# Patient Record
Sex: Male | Born: 1990 | Race: Black or African American | Hispanic: No | Marital: Single | Smoking: Current some day smoker
Health system: Southern US, Community
[De-identification: ages and names within clinical notes are randomized; demographics above are authoritative.]

---
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
## 2018-06-13 LAB — HM HIV SCREENING LAB: HM HIV Screening: NEGATIVE

## 2019-03-04 ENCOUNTER — Ambulatory Visit: Payer: Self-pay

## 2019-05-13 ENCOUNTER — Ambulatory Visit: Payer: Self-pay | Admitting: Family Medicine

## 2019-05-13 ENCOUNTER — Other Ambulatory Visit: Payer: Self-pay

## 2019-05-13 ENCOUNTER — Encounter: Payer: Self-pay | Admitting: Family Medicine

## 2019-05-13 DIAGNOSIS — Z202 Contact with and (suspected) exposure to infections with a predominantly sexual mode of transmission: Secondary | ICD-10-CM

## 2019-05-13 DIAGNOSIS — Z113 Encounter for screening for infections with a predominantly sexual mode of transmission: Secondary | ICD-10-CM

## 2019-05-13 MED ORDER — CEFTRIAXONE SODIUM 250 MG IJ SOLR: 250.0000 mg | Freq: Once | INTRAMUSCULAR | Status: DC

## 2019-05-13 NOTE — Progress Notes (Signed)
Patient left from lab, never came back to clinic for treatment as contact to [facility]. Per C. Clydene Laming, TC to patient and patient telephone not accepting calls at this time. Jenetta Downer, RN

## 2019-05-13 NOTE — Progress Notes (Signed)
    STI clinic/screening visit  Subjective:  ELIA KEENUM is a 28 y.o. male being seen today for an STI screening visit. The patient reports they do have symptoms.  Patient has the following medical conditions:  There are no active problems to display for this patient.  Chief Complaint  Patient presents with  . Exposure to STD    HPI  Patient reports he is a contact to [facility].  He has a sm amt of yellow disch and "feels funny" when urinates.  See flowsheet for further details and programmatic requirements.    The following portions of the patient's history were reviewed and updated as appropriate: allergies, current medications, past medical history, past social history, past surgical history and problem list.  Objective:  There were no vitals filed for this visit.  Physical Exam HENT:     Mouth/Throat:     Mouth: Mucous membranes are moist.     Pharynx: No oropharyngeal exudate or posterior oropharyngeal erythema.  Neck:     Musculoskeletal: Neck supple. No muscular tenderness.  Genitourinary:    Penis: Normal.      Scrotum/Testes: Normal.     Comments: No discharge noted Lymphadenopathy:     Cervical: No cervical adenopathy.  Skin:    General: Skin is warm and dry.     Findings: No lesion or rash.  Neurological:     Mental Status: He is alert.    Assessment and Plan:  RUTHERFORD ALARIE is a 28 y.o. male presenting to the [facility] Department for STI screening  1. Screening examination for venereal disease - Ct, Ng, Mycoplasmas NAA, Urine  2. Exposure to sexually transmitted disease (STD) Contact to [facility]. - cefTRIAXone (ROCEPHIN) injection 250 mg -Azithromycin 1000 mg po once Co. No sexual activity x 1 wk.  Co. To use condoms always. Client left clinic before her received treatment for contact to [facility].  The clinic staff attempted to contact him by cell phone but were unable to leave a message     No follow-ups on file.  No future  appointments.  Hassell Done, FNP

## 2019-05-13 NOTE — Progress Notes (Signed)
Patient here for STD testing.Fleur Audino Brewer-Jensen, RN 

## 2019-05-16 ENCOUNTER — Other Ambulatory Visit: Payer: Self-pay

## 2019-05-16 ENCOUNTER — Telehealth: Payer: Self-pay | Admitting: General Practice

## 2019-05-16 ENCOUNTER — Ambulatory Visit: Payer: Self-pay

## 2019-05-16 DIAGNOSIS — Z202 Contact with and (suspected) exposure to infections with a predominantly sexual mode of transmission: Secondary | ICD-10-CM

## 2019-05-16 LAB — CT, NG, MYCOPLASMAS NAA, URINE
Chlamydia trachomatis, NAA: NEGATIVE
Mycoplasma genitalium NAA: NEGATIVE
Mycoplasma hominis NAA: NEGATIVE
Neisseria gonorrhoeae, NAA: POSITIVE — AB
Ureaplasma spp NAA: NEGATIVE

## 2019-05-16 MED ORDER — CEFTRIAXONE SODIUM 250 MG IJ SOLR
250.0000 mg | Freq: Once | INTRAMUSCULAR | Status: AC
  Administered 2019-05-16: 14:00:00 250 mg via INTRAMUSCULAR

## 2019-05-16 MED ORDER — AZITHROMYCIN 500 MG PO TABS
500.0000 mg | ORAL_TABLET | Freq: Once | ORAL | Status: AC
  Administered 2019-05-16: 1000 mg via ORAL

## 2019-07-31 ENCOUNTER — Telehealth: Payer: Self-pay | Admitting: Emergency Medicine

## 2019-07-31 DIAGNOSIS — J029 Acute pharyngitis, unspecified: Secondary | ICD-10-CM

## 2019-07-31 NOTE — Progress Notes (Signed)
Based on what you shared with me, I feel your condition warrants further evaluation and I recommend that you be seen for a face to face visit.  You reported several symptoms including neck stiffness, changes in your voice, neck swelling.  These are concerning and warrant a physical examination and face to face encounter to rule out more severe medical conditions.  Please contact your primary care physician practice to be seen. Many offices offer virtual options to be seen via video if you are not comfortable going in person to a medical facility at this time.  If you do not have a PCP, [facility] offers a free physician referral service available at [phone]. Our trained staff has the experience, knowledge and resources to put you in touch with a physician who is right for you.   You also have the option of a video visit through [email or web address]  If you are having a true medical emergency please call 911.  NOTE: If you entered your credit card information for this eVisit, you will not be charged. You may see a "hold" on your card for the $35 but that hold will drop off and you will not have a charge processed.  Your e-visit answers were reviewed by a board certified advanced clinical practitioner to complete your personal care plan.  Thank you for using e-Visits.

## 2019-12-17 NOTE — Telephone Encounter (Signed)
Opened in error

## 2022-03-12 ENCOUNTER — Emergency Department (HOSPITAL_COMMUNITY): Payer: BC Managed Care – PPO

## 2022-03-12 ENCOUNTER — Emergency Department (HOSPITAL_COMMUNITY)
Admission: EM | Admit: 2022-03-12 | Discharge: 2022-03-12 | Disposition: A | Discharge status: Home / Self Care | Payer: BC Managed Care – PPO | Attending: Emergency Medicine | Admitting: Emergency Medicine

## 2022-03-12 ENCOUNTER — Encounter (HOSPITAL_COMMUNITY): Payer: Self-pay

## 2022-03-12 ENCOUNTER — Other Ambulatory Visit: Payer: Self-pay

## 2022-03-12 DIAGNOSIS — R944 Abnormal results of kidney function studies: Secondary | ICD-10-CM | POA: Diagnosis not present

## 2022-03-12 DIAGNOSIS — F17210 Nicotine dependence, cigarettes, uncomplicated: Secondary | ICD-10-CM | POA: Insufficient documentation

## 2022-03-12 DIAGNOSIS — R42 Dizziness and giddiness: Secondary | ICD-10-CM | POA: Diagnosis present

## 2022-03-12 DIAGNOSIS — Z79899 Other long term (current) drug therapy: Secondary | ICD-10-CM | POA: Diagnosis not present

## 2022-03-12 DIAGNOSIS — R0789 Other chest pain: Secondary | ICD-10-CM | POA: Insufficient documentation

## 2022-03-12 DIAGNOSIS — F419 Anxiety disorder, unspecified: Secondary | ICD-10-CM | POA: Insufficient documentation

## 2022-03-12 DIAGNOSIS — T43625A Adverse effect of amphetamines, initial encounter: Secondary | ICD-10-CM | POA: Insufficient documentation

## 2022-03-12 DIAGNOSIS — T887XXA Unspecified adverse effect of drug or medicament, initial encounter: Secondary | ICD-10-CM | POA: Insufficient documentation

## 2022-03-12 DIAGNOSIS — E860 Dehydration: Secondary | ICD-10-CM

## 2022-03-12 DIAGNOSIS — E876 Hypokalemia: Secondary | ICD-10-CM | POA: Diagnosis not present

## 2022-03-12 LAB — URINALYSIS, ROUTINE W REFLEX MICROSCOPIC
Bacteria, UA: NONE SEEN
Bilirubin Urine: NEGATIVE
Glucose, UA: NEGATIVE mg/dL
Hgb urine dipstick: NEGATIVE
Ketones, ur: 20 mg/dL — AB
Nitrite: NEGATIVE
Protein, ur: NEGATIVE mg/dL
Specific Gravity, Urine: 1.01 (ref 1.005–1.030)
pH: 8 (ref 5.0–8.0)

## 2022-03-12 LAB — RAPID URINE DRUG SCREEN, HOSP PERFORMED
Amphetamines: POSITIVE — AB
Barbiturates: NOT DETECTED
Benzodiazepines: NOT DETECTED
Cocaine: NOT DETECTED
Opiates: NOT DETECTED
Tetrahydrocannabinol: POSITIVE — AB

## 2022-03-12 LAB — COMPREHENSIVE METABOLIC PANEL WITH GFR
ALT: 35 U/L (ref 0–44)
AST: 34 U/L (ref 15–41)
Albumin: 4.4 g/dL (ref 3.5–5.0)
Alkaline Phosphatase: 73 U/L (ref 38–126)
Anion gap: 11 (ref 5–15)
BUN: 17 mg/dL (ref 6–20)
CO2: 18 mmol/L — ABNORMAL LOW (ref 22–32)
Calcium: 10 mg/dL (ref 8.9–10.3)
Chloride: 110 mmol/L (ref 98–111)
Creatinine, Ser: 1.39 mg/dL — ABNORMAL HIGH (ref 0.61–1.24)
GFR, Estimated: >60 mL/min
Glucose, Bld: 106 mg/dL — ABNORMAL HIGH (ref 70–99)
Potassium: 3.1 mmol/L — ABNORMAL LOW (ref 3.5–5.1)
Sodium: 139 mmol/L (ref 135–145)
Total Bilirubin: 1.8 mg/dL — ABNORMAL HIGH (ref 0.3–1.2)
Total Protein: 8.2 g/dL — ABNORMAL HIGH (ref 6.5–8.1)

## 2022-03-12 LAB — CBC
HCT: 38.2 % — ABNORMAL LOW (ref 39.0–52.0)
Hemoglobin: 13.5 g/dL (ref 13.0–17.0)
MCH: 32.7 pg (ref 26.0–34.0)
MCHC: 35.3 g/dL (ref 30.0–36.0)
MCV: 92.5 fL (ref 80.0–100.0)
Platelets: 258 10*3/uL (ref 150–400)
RBC: 4.13 MIL/uL — ABNORMAL LOW (ref 4.22–5.81)
RDW: 12.5 % (ref 11.5–15.5)
WBC: 7 10*3/uL (ref 4.0–10.5)
nRBC: 0 % (ref 0.0–0.2)

## 2022-03-12 LAB — CBG MONITORING, ED: Glucose-Capillary: 119 mg/dL — ABNORMAL HIGH (ref 70–99)

## 2022-03-12 MED ORDER — HYDROXYZINE HCL 25 MG PO TABS
25.0000 mg | ORAL_TABLET | Freq: Once | ORAL | Status: AC
  Administered 2022-03-12: 25 mg via ORAL
  Filled 2022-03-12: qty 1

## 2022-03-12 MED ORDER — POTASSIUM CHLORIDE CRYS ER 20 MEQ PO TBCR
40.0000 meq | EXTENDED_RELEASE_TABLET | Freq: Once | ORAL | Status: AC
  Administered 2022-03-12: 40 meq via ORAL
  Filled 2022-03-12: qty 2

## 2022-03-12 NOTE — Discharge Instructions (Addendum)
Your drug screen today is positive for marijuana as well as amphetamines.  The remainder of your evaluation has been reassuring.  You did have a low potassium which is likely related to diet and can be rechecked by a primary care doctor along with your kidney function which was slightly elevated today.  We recommend follow-up with a primary care doctor within 2 to 4 weeks.  Discontinue use of illicit substances.  Return for new or concerning symptoms.

## 2022-03-12 NOTE — ED Provider Triage Note (Signed)
Emergency Medicine Provider Triage Evaluation Note  Herbert Montgomery , a 31 y.o. male  was evaluated in triage.  Pt complains of feeling some tingling in his extremities, chest tightness, panicky sensation, shortness of breath after smoking and drinking last night.  Patient reports that he thinks that what he was smoking may have been laced with something other than marijuana.  He is not sure what it could have been laced with.  He denies any overt chest pain.  When asked to clarify his tingling sensation he denies numbness.  He denies headache, but does not endorse some mild blurry vision.  He denies double vision..  Review of Systems  Positive: Chest tightness, shob, tingling, blurry vision Negative: Chest pain, fever, chills  Physical Exam  BP (!) 194/107 (BP Location: Left Arm)   Pulse 79   Temp 97.8 F (36.6 C) (Oral)   Resp 16   Ht 6\' 4"  (1.93 m)   Wt 120.2 kg   SpO2 96%   BMI 32.26 kg/m  Gen:   Awake, anxious appearing Resp:  Normal effort  MSK:   Moves extremities without difficulty  Other:  Normal sensation throughout, moves all extremities without difficulty  Medical Decision Making  Medically screening exam initiated at 3:14 PM.  Appropriate orders placed.  was informed that the remainder of the evaluation will be completed by another provider, this initial triage assessment does not replace that evaluation, and the importance of remaining in the ED until their evaluation is complete.  Workup initiated   Archie Balboa, Olene Floss 03/12/22 1515

## 2022-03-12 NOTE — ED Provider Notes (Signed)
[facility]-EMERGENCY DEPT Provider Note   CSN: [id number] Arrival date & time: 03/12/22  1453     History  Chief Complaint  Patient presents with   Chest Pain   Dizziness    Herbert Montgomery is a 31 y.o. male.  31 year old male presents to the emergency department complaining of chest tightness and a panicky sensation with shortness of breath.  Symptoms have been waxing and waning since onset and intermittent.  He denies any known aggravating factors, but states that his symptoms began after drinking alcohol and smoking marijuana last night.  Reports being told by his friend that the marijuana was laced with something, but he is unsure what.  He has not attempted any interventions prior to arrival.  Does note that he has had sporadic episodes of associated paresthesias in his bilateral hands and around his mouth.  No associated numbness, syncope.  The history is provided by the patient. No language interpreter was used.  Chest Pain Associated symptoms: dizziness   Dizziness Associated symptoms: chest pain        Home Medications Prior to Admission medications   Not on File      Allergies    Patient has no known allergies.    Review of Systems   Review of Systems  Cardiovascular:  Positive for chest pain.  Neurological:  Positive for dizziness.  Ten systems reviewed and are negative for acute change, except as noted in the HPI.    Physical Exam Updated Vital Signs BP (!) 162/90   Pulse 71   Temp 97.8 F (36.6 C) (Oral)   Resp (!) 23   Ht 6\' 4"  (1.93 m)   Wt 120.2 kg   SpO2 100%   BMI 32.26 kg/m   Physical Exam Vitals and nursing note reviewed.  Constitutional:      General: He is not in acute distress.    Appearance: He is well-developed. He is not diaphoretic.     Comments: Anxious appearing  HENT:     Head: Normocephalic and atraumatic.  Eyes:     General: No scleral icterus.    Conjunctiva/sclera: Conjunctivae normal.   Cardiovascular:     Rate and Rhythm: Normal rate and regular rhythm.     Pulses: Normal pulses.  Pulmonary:     Effort: Pulmonary effort is normal. No respiratory distress.     Breath sounds: No stridor. No wheezing.     Comments: Intermittent tachypnea without dyspnea; mild. Chest expansion symmetric. Lungs CTAB. Musculoskeletal:        General: Normal range of motion.     Cervical back: Normal range of motion.  Skin:    General: Skin is warm and dry.     Coloration: Skin is not pale.     Findings: No erythema or rash.  Neurological:     Mental Status: He is alert and oriented to person, place, and time.     Coordination: Coordination normal.  Psychiatric:        Behavior: Behavior normal.     ED Results / Procedures / Treatments   Labs (all labs ordered are listed, but only abnormal results are displayed) Labs Reviewed  CBC - Abnormal; Notable for the following components:      Result Value   RBC 4.13 (*)    HCT 38.2 (*)    All other components within normal limits  COMPREHENSIVE METABOLIC PANEL - Abnormal; Notable for the following components:   Potassium 3.1 (*)    CO2  18 (*)    Glucose, Bld 106 (*)    Creatinine, Ser 1.39 (*)    Total Protein 8.2 (*)    Total Bilirubin 1.8 (*)    All other components within normal limits  URINALYSIS, ROUTINE W REFLEX MICROSCOPIC - Abnormal; Notable for the following components:   Ketones, ur 20 (*)    Leukocytes,Ua SMALL (*)    All other components within normal limits  RAPID URINE DRUG SCREEN, HOSP PERFORMED - Abnormal; Notable for the following components:   Amphetamines POSITIVE (*)    Tetrahydrocannabinol POSITIVE (*)    All other components within normal limits  CBG MONITORING, ED - Abnormal; Notable for the following components:   Glucose-Capillary 119 (*)    All other components within normal limits  MAGNESIUM    EKG EKG Interpretation  Date/Time:  Sunday March 12 2022 15:20:31 EDT Ventricular Rate:  76 PR  Interval:  147 QRS Duration: 95 QT Interval:  368 QTC Calculation: 414 R Axis:   20 Text Interpretation: Sinus rhythm Probable anteroseptal infarct, old Confirmed by Virgina Norfolk [phone]) on 03/12/2022 10:48:34 PM  Radiology DG Chest 2 View  Result Date: 03/12/2022 CLINICAL DATA:  Acute chest pain and shortness of breath. EXAM: CHEST - 2 VIEW COMPARISON:  None Available. FINDINGS: The cardiomediastinal silhouette is unremarkable. There is no evidence of focal airspace disease, pulmonary edema, suspicious pulmonary nodule/mass, pleural effusion, or pneumothorax. No acute bony abnormalities are identified. IMPRESSION: No active cardiopulmonary disease. Electronically Signed   By: Harmon Pier M.D.   On: 03/12/2022 15:49    Procedures Procedures    Medications Ordered in ED Medications  potassium chloride SA (KLOR-CON M) CR tablet 40 mEq (has no administration in time range)  hydrOXYzine (ATARAX) tablet 25 mg (has no administration in time range)    ED Course/ Medical Decision Making/ A&P Clinical Course as of 03/12/22 2304  Sun Mar 12, 2022  2258 Labs reviewed and notable for creatinine of 1.39.  This is favored to reflect dehydration given mild associated ketonuria.  Hypokalemia of 3.1 repleted with oral potassium.  Magnesium level added.  UDS notable for amphetamines as well as marijuana c/w reported story of smoking "laced" marijuana yesterday.  [KH]    Clinical Course User Index [KH] Antony Madura, PA-C                           Medical Decision Making Amount and/or Complexity of Data Reviewed Labs: ordered.  Risk Prescription drug management.   This patient presents to the ED for concern of chest tightness, SOB, paresthesias to face and hands, this involves an extensive number of treatment options, and is a complaint that carries with it a high risk of complications and morbidity.  The differential diagnosis includes substance abuse vs anxiety attack vs electrolyte derangement  vs ACS   Co morbidities that complicate the patient evaluation  None    Additional history obtained:  External records from outside source obtained and reviewed including prior BP readings from urgent care visit in 2022    Lab Tests:  I Ordered, and personally interpreted labs.  The pertinent results include:  potassium of 3.1, creatinine of 1.39, UDS positive for THC, amphetamines.   Imaging Studies ordered:  I ordered imaging studies including CXR  I independently visualized and interpreted imaging which showed no acute cardiopulmonary abnormality I agree with the radiologist interpretation   Cardiac Monitoring:  The patient was maintained on a cardiac monitor.  I personally viewed and interpreted the cardiac monitored which showed an underlying rhythm of: NSR   Medicines ordered and prescription drug management:  I ordered medication including atarax for anxiety, potassium for hypokalemia  Reevaluation of the patient after these medicines showed that the patient improved I have reviewed the patients home medicines and have made adjustments as needed   Test Considered:  Troponin    Critical Interventions:  Oral potassium for repletion Atarax for symptom management   Reevaluation:  After the interventions noted above, I reevaluated the patient and found that they have : remained stable   Social Determinants of Health:  Cigarette smoker Uninsured    Dispostion:  After consideration of the diagnostic results and the patients response to treatment, I feel that the patent would benefit from discontinuation of illicit substance use. Suspect panic attacks/heightened anxiety episodes initially triggered by amphetamine use. Work up today has been otherwise reassuring.  Encouraged PCP follow up for recheck of potassium and creatinine level. Return precautions discussed and provided. Patient discharged in stable condition with no unaddressed concerns.           Final Clinical Impression(s) / ED Diagnoses Final diagnoses:  Amphetamine adverse reaction, initial encounter  Hypokalemia  Mild dehydration    Rx / DC Orders ED Discharge Orders     None         Antony Madura, PA-C 03/12/22 2312    Virgina Norfolk, DO 03/12/22 2320

## 2022-03-12 NOTE — ED Triage Notes (Addendum)
Pt reports he was drinking and smoking marijuana last night. Pt thinks the marijuana may have been laced with something. Pt is anxious and jittery in triage. Pt endorses chest pain, SHOB, tingling in extremities, and dizziness.

## 2022-03-13 ENCOUNTER — Emergency Department (HOSPITAL_BASED_OUTPATIENT_CLINIC_OR_DEPARTMENT_OTHER): Payer: BC Managed Care – PPO

## 2022-03-13 ENCOUNTER — Encounter (HOSPITAL_BASED_OUTPATIENT_CLINIC_OR_DEPARTMENT_OTHER): Payer: Self-pay

## 2022-03-13 ENCOUNTER — Other Ambulatory Visit: Payer: Self-pay

## 2022-03-13 ENCOUNTER — Emergency Department (HOSPITAL_BASED_OUTPATIENT_CLINIC_OR_DEPARTMENT_OTHER)
Admission: EM | Admit: 2022-03-13 | Discharge: 2022-03-13 | Disposition: A | Discharge status: Home / Self Care | Payer: BC Managed Care – PPO | Attending: Emergency Medicine | Admitting: Emergency Medicine

## 2022-03-13 DIAGNOSIS — R200 Anesthesia of skin: Secondary | ICD-10-CM | POA: Diagnosis present

## 2022-03-13 DIAGNOSIS — I10 Essential (primary) hypertension: Secondary | ICD-10-CM | POA: Insufficient documentation

## 2022-03-13 DIAGNOSIS — F17200 Nicotine dependence, unspecified, uncomplicated: Secondary | ICD-10-CM | POA: Diagnosis not present

## 2022-03-13 DIAGNOSIS — F419 Anxiety disorder, unspecified: Secondary | ICD-10-CM | POA: Insufficient documentation

## 2022-03-13 LAB — MAGNESIUM: Magnesium: 2.7 mg/dL — ABNORMAL HIGH (ref 1.7–2.4)

## 2022-03-13 MED ORDER — LORAZEPAM 1 MG PO TABS
0.5000 mg | ORAL_TABLET | Freq: Once | ORAL | Status: AC
Start: 2022-03-13 — End: 2022-03-13
  Administered 2022-03-13: 0.5 mg via ORAL
  Filled 2022-03-13: qty 1

## 2022-03-13 NOTE — ED Triage Notes (Signed)
Patient here POV from Home.  Endorses trying Amphetamine Saturday PM/Sunday AM. States he was Jittery and Anxious afterwards. Seen in ED yesterday PM for Same and discharged.   States However Symptoms have Continued and Symptoms include Numbness to Right Face and Right Arm. Right Arm Numbness began Sunday AM and Right Facial Numbness began at 0800 today.   No Pain. No SOB.   Anxious in Triage. A&Ox4. GCS 15. Ambulatory.

## 2022-03-13 NOTE — ED Provider Notes (Signed)
[facility] EMERGENCY DEPT Provider Note   CSN: [id number] Arrival date & time: 03/13/22  1256     History  Chief Complaint  Patient presents with   Numbness    Herbert Montgomery is a 31 y.o. male with no known past medical history presenting today with the complaint of numbness to various parts of his body since yesterday.  He says he first felt it in his left arm, that his right arm, then his left face, then his fingers.  He says it all started after smoking meth last night.  He was seen at [facility] and discharged and says he has not used any drugs since then.  HPI     Home Medications Prior to Admission medications   Not on File      Allergies    Patient has no known allergies.    Review of Systems   Review of Systems  Physical Exam Updated Vital Signs BP (!) 162/97 (BP Location: Right Arm)   Pulse (!) 57   Temp 98.9 F (37.2 C) (Oral)   Resp 17   Ht 6\' 4"  (1.93 m)   Wt 120.2 kg   SpO2 100%   BMI 32.26 kg/m  Physical Exam Vitals and nursing note reviewed.  Constitutional:      Appearance: Normal appearance.  HENT:     Head: Normocephalic and atraumatic.     Mouth/Throat:     Mouth: Mucous membranes are moist.     Pharynx: Oropharynx is clear.  Eyes:     General: No scleral icterus.    Conjunctiva/sclera: Conjunctivae normal.  Pulmonary:     Effort: Pulmonary effort is normal. No respiratory distress.  Skin:    Findings: No rash.  Neurological:     Mental Status: He is alert.     Comments: Cranial nerves II through XII grossly intact.  Normal strength of bilateral upper and lower extremities.  Ambulatory in the hallway.  No problem with finger-nose or heel shin  Psychiatric:     Comments: Very anxious     ED Results / Procedures / Treatments   Labs (all labs ordered are listed, but only abnormal results are displayed) Labs Reviewed - No data to display  EKG EKG Interpretation  Date/Time:  Monday March 13 2022 14:40:18  EDT Ventricular Rate:  55 PR Interval:  154 QRS Duration: 94 QT Interval:  438 QTC Calculation: 419 R Axis:   17 Text Interpretation: Sinus rhythm Confirmed by 06-04-1994 [phone]) on 03/13/2022 3:01:05 PM  Radiology CT Head Wo Contrast  Result Date: 03/13/2022 CLINICAL DATA:  Neuro deficit, drug use EXAM: CT HEAD WITHOUT CONTRAST TECHNIQUE: Contiguous axial images were obtained from the base of the skull through the vertex without intravenous contrast. RADIATION DOSE REDUCTION: This exam was performed according to the departmental dose-optimization program which includes automated exposure control, adjustment of the mA and/or kV according to patient size and/or use of iterative reconstruction technique. COMPARISON:  None Available. FINDINGS: Brain: No acute intracranial hemorrhage, mass effect, or herniation. No extra-axial fluid collections. No evidence of acute territorial infarct. No hydrocephalus. Vascular: No hyperdense vessel or unexpected calcification. Skull: Normal. Negative for fracture or focal lesion. Sinuses/Orbits: No acute finding. Other: None. IMPRESSION: No acute intracranial process identified. Electronically Signed   By: 03/15/2022 M.D.   On: 03/13/2022 14:53   DG Chest 2 View  Result Date: 03/12/2022 CLINICAL DATA:  Acute chest pain and shortness of breath. EXAM: CHEST - 2 VIEW COMPARISON:  None Available. FINDINGS: The cardiomediastinal silhouette is unremarkable. There is no evidence of focal airspace disease, pulmonary edema, suspicious pulmonary nodule/mass, pleural effusion, or pneumothorax. No acute bony abnormalities are identified. IMPRESSION: No active cardiopulmonary disease. Electronically Signed   By: Harmon Pier M.D.   On: 03/12/2022 15:49    Procedures Procedures   Medications Ordered in ED Medications  LORazepam (ATIVAN) tablet 0.5 mg (0.5 mg Oral Given 03/13/22 1438)    ED Course/ Medical Decision Making/ A&P                           Medical  Decision Making Amount and/or Complexity of Data Reviewed Radiology: ordered.  Risk Prescription drug management.   This patient presents to the ED for concern of numbness.  Differential includes but is not limited to electrolyte abnormality, substance use, CVA, brain mass, neuropathy.   This is not an exhaustive differential.    Past Medical History / Co-morbidities / Social History: Recent drug use   Additional history: Chart review.  Patient had episodes of hypertension years ago.  Reviewed patient's visit to the ED yesterday as well.  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   Physical Exam: No pertinent findings outside of patient's anxiety.  Lab Tests: Not repeated from last night   Imaging Studies: I ordered and independently visualized and interpreted CT head which was normal.  I agree with radiologist.   Medications: I ordered a small dose of Ativan which he reports helped on reevaluation.   Disposition: After consideration of the diagnostic results and the patients response to treatment, I feel that patient does not have an emergent condition requiring hospitalization today.  I considered admission however patient is likely suffering from anxiety or left over side effect of illicit drugs.  It is possible that he has hypertension at baseline so I referred him to primary care to be sure that this is followed.  Return precautions discussed and he voiced understanding.   I discussed this case with my attending physician Dr. Jacqulyn Bath who cosigned this note including patient's presenting symptoms, physical exam, and planned diagnostics and interventions. Attending physician stated agreement with plan or made changes to plan which were implemented.      Final Clinical Impression(s) / ED Diagnoses Final diagnoses:  Anxiety    Rx / DC Orders ED Discharge Orders     None      Results and diagnoses  were explained to the patient. Return precautions discussed in full. Patient had no additional questions and expressed complete understanding.   This chart was dictated using voice recognition software.  Despite best efforts to proofread,  errors can occur which can change the documentation meaning.    Woodroe Chen 03/13/22 1546    Maia Plan, MD 03/14/22 [phone]

## 2022-03-13 NOTE — Discharge Instructions (Signed)
I have attached your primary care offices to these papers however you may call any office of your choosing for follow-up.  I suspect this is all secondary to your weekend ingestion.  Return with any worsening symptoms however your CT today and your lab work yesterday are reassuring.

## 2022-03-13 NOTE — ED Notes (Signed)
MD J. Long consulted regarding Patient Presentation. Patient to be assessed in Examination Room when Available by EDP prior to Imaging.

## 2022-03-14 ENCOUNTER — Emergency Department (HOSPITAL_COMMUNITY): Payer: BC Managed Care – PPO

## 2022-03-14 ENCOUNTER — Emergency Department (HOSPITAL_COMMUNITY)
Admission: EM | Admit: 2022-03-14 | Discharge: 2022-03-14 | Disposition: A | Discharge status: Home / Self Care | Payer: BC Managed Care – PPO | Attending: Emergency Medicine | Admitting: Emergency Medicine

## 2022-03-14 ENCOUNTER — Other Ambulatory Visit: Payer: Self-pay

## 2022-03-14 ENCOUNTER — Encounter (HOSPITAL_COMMUNITY): Payer: Self-pay

## 2022-03-14 DIAGNOSIS — R0789 Other chest pain: Secondary | ICD-10-CM | POA: Diagnosis present

## 2022-03-14 DIAGNOSIS — F419 Anxiety disorder, unspecified: Secondary | ICD-10-CM | POA: Insufficient documentation

## 2022-03-14 DIAGNOSIS — R072 Precordial pain: Secondary | ICD-10-CM | POA: Insufficient documentation

## 2022-03-14 DIAGNOSIS — E876 Hypokalemia: Secondary | ICD-10-CM | POA: Insufficient documentation

## 2022-03-14 LAB — BASIC METABOLIC PANEL
Anion gap: 11 (ref 5–15)
BUN: 18 mg/dL (ref 6–20)
CO2: 18 mmol/L — ABNORMAL LOW (ref 22–32)
Calcium: 9.4 mg/dL (ref 8.9–10.3)
Chloride: 108 mmol/L (ref 98–111)
Creatinine, Ser: 1.53 mg/dL — ABNORMAL HIGH (ref 0.61–1.24)
GFR, Estimated: >60 mL/min (ref >60)
Glucose, Bld: 74 mg/dL (ref 70–99)
Potassium: 3.2 mmol/L — ABNORMAL LOW (ref 3.5–5.1)
Sodium: 137 mmol/L (ref 135–145)

## 2022-03-14 LAB — CBC WITH DIFFERENTIAL/PLATELET
Abs Immature Granulocytes: 0.01 10*3/uL (ref 0.00–0.07)
Basophils Absolute: 0 10*3/uL (ref 0.0–0.1)
Basophils Relative: 0 %
Eosinophils Absolute: 0.1 10*3/uL (ref 0.0–0.5)
Eosinophils Relative: 1 %
HCT: 41.7 % (ref 39.0–52.0)
Hemoglobin: 14.3 g/dL (ref 13.0–17.0)
Immature Granulocytes: 0 %
Lymphocytes Relative: 34 %
Lymphs Abs: 1.7 10*3/uL (ref 0.7–4.0)
MCH: 32.6 pg (ref 26.0–34.0)
MCHC: 34.3 g/dL (ref 30.0–36.0)
MCV: 95 fL (ref 80.0–100.0)
Monocytes Absolute: 0.7 10*3/uL (ref 0.1–1.0)
Monocytes Relative: 13 %
Neutro Abs: 2.6 10*3/uL (ref 1.7–7.7)
Neutrophils Relative %: 52 %
Platelets: 249 10*3/uL (ref 150–400)
RBC: 4.39 MIL/uL (ref 4.22–5.81)
RDW: 12.8 % (ref 11.5–15.5)
WBC: 5 10*3/uL (ref 4.0–10.5)
nRBC: 0 % (ref 0.0–0.2)

## 2022-03-14 LAB — TROPONIN I (HIGH SENSITIVITY): Troponin I (High Sensitivity): 3 ng/L (ref <18)

## 2022-03-14 MED ORDER — HYDROXYZINE HCL 25 MG PO TABS: 25.0000 mg | ORAL_TABLET | Freq: Four times a day (QID) | ORAL | 0 refills | Status: AC

## 2022-03-14 NOTE — ED Provider Notes (Signed)
[facility]-EMERGENCY DEPT Provider Note   CSN: [id number] Arrival date & time: 03/14/22  1351     History  Chief Complaint  Patient presents with   Chest Pain    Herbert Montgomery is a 31 y.o. male here for evaluation of intermittent chest tightness.  Symptoms intermittent in nature.  States symptoms began after working some alcohol and smoking marijuana which was laced with amphetamines.  States intermittently he will feel like he has chest tightness, palpitations and then will feel intermittent pin prick sensations all over his body.  These are never unilateral.  Last for few seconds and resolved.  States when this occurs he feels anxious.  He denies any exertional or pleuritic chest pain.  He has no facial droop, lateral weakness, numbness.  He denies any current pain.  This is his third time being assessed for similar.  No history of PE, DVT, recent surgery, mobilization or malignancy.  No headache, back pain, abdominal pain.  States he was given a medication yesterday in the ED which helped his symptoms however he was not discharged with any medications.  He denies any SI, HI, AVH.  He has never had anything like this previously.  No history of AAA, dissection. No recent sick contacts, Fever, emesis.  HPI     Home Medications Prior to Admission medications   Medication Sig Start Date End Date Taking? Authorizing Provider  hydrOXYzine (ATARAX) 25 MG tablet Take 1 tablet (25 mg total) by mouth every 6 (six) hours. 03/14/22  Yes Parisa Pinela A, PA-C      Allergies    Patient has no known allergies.    Review of Systems   Review of Systems  Constitutional: Negative.   HENT: Negative.    Respiratory: Negative.    Cardiovascular:  Positive for chest pain (intermittent tightness, none currently).  Gastrointestinal: Negative.   Genitourinary: Negative.   Musculoskeletal: Negative.   Skin: Negative.   Neurological:  Positive for numbness. Negative for  dizziness, tremors, seizures, syncope, facial asymmetry, speech difficulty, weakness, light-headedness and headaches.  All other systems reviewed and are negative.  Physical Exam Updated Vital Signs BP (!) 151/96   Pulse 79   Temp 98.9 F (37.2 C) (Oral)   Resp 18   Ht 6\' 4"  (1.93 m)   Wt 120.2 kg   SpO2 100%   BMI 32.26 kg/m  Physical Exam Physical Exam  Constitutional: Pt is oriented to person, place, and time. Pt appears well-developed and well-nourished. No distress.  HENT:  Head: Normocephalic and atraumatic.  Mouth/Throat: Oropharynx is clear and moist.  Eyes: Conjunctivae and EOM are normal. Pupils are equal, round, and reactive to light. No scleral icterus.  Neck: Normal range of motion. Neck supple.  Full active and passive ROM without pain No midline or paraspinal tenderness No nuchal rigidity or meningeal signs  Cardiovascular: Normal rate, regular rhythm and intact distal pulses.   Pulmonary/Chest: Effort normal and breath sounds normal. No respiratory distress. Pt has no wheezes. No rales.  Abdominal: Soft. Bowel sounds are normal. There is no tenderness. There is no rebound and no guarding.  Musculoskeletal: Normal range of motion.  Lymphadenopathy:    No cervical adenopathy.  Neurological: Pt. is alert and oriented to person, place, and time. He has normal reflexes. No cranial nerve deficit.  Exhibits normal muscle tone. Coordination normal.  Mental Status:  Alert, oriented, thought content appropriate. Speech fluent without evidence of aphasia. Able to follow 2 step commands without difficulty.  Cranial Nerves:  II:  Peripheral visual fields grossly normal, pupils equal, round, reactive to light III,IV, VI: ptosis not present, extra-ocular motions intact bilaterally  V,VII: smile symmetric, facial light touch sensation equal VIII: hearing grossly normal bilaterally  IX,X: midline uvula rise  XI: bilateral shoulder shrug equal and strong XII: midline tongue  extension  Motor:  5/5 in upper and lower extremities bilaterally including strong and equal grip strength and dorsiflexion/plantar flexion Sensory: Pinprick and light touch normal in all extremities.  Deep Tendon Reflexes: 2+ and symmetric  Cerebellar: normal finger-to-nose with bilateral upper extremities Gait: normal gait and balance CV: distal pulses palpable throughout   Skin: Skin is warm and dry. No rash noted. Pt is not diaphoretic.  Psychiatric: Pt has a normal mood and affect. Behavior is normal. Judgment and thought content normal.  Nursing note and vitals reviewed.  ED Results / Procedures / Treatments   Labs (all labs ordered are listed, but only abnormal results are displayed) Labs Reviewed  BASIC METABOLIC PANEL - Abnormal; Notable for the following components:      Result Value   Potassium 3.2 (*)    CO2 18 (*)    Creatinine, Ser 1.53 (*)    All other components within normal limits  CBC WITH DIFFERENTIAL/PLATELET  TROPONIN I (HIGH SENSITIVITY)  TROPONIN I (HIGH SENSITIVITY)    EKG None  Radiology DG Chest 2 View  Result Date: 03/14/2022 CLINICAL DATA:  Tachycardia.  Chest pain with shortness of breath. EXAM: CHEST - 2 VIEW COMPARISON:  Radiograph 03/12/2022. FINDINGS: The heart size and mediastinal contours are normal. The lungs are clear. There is no pleural effusion or pneumothorax. No acute osseous findings are identified. Stable old healed fracture of the mid left clavicle. IMPRESSION: Stable chest.  No evidence of active cardiopulmonary process. Electronically Signed   By: Carey Bullocks M.D.   On: 03/14/2022 15:47   CT Head Wo Contrast  Result Date: 03/13/2022 CLINICAL DATA:  Neuro deficit, drug use EXAM: CT HEAD WITHOUT CONTRAST TECHNIQUE: Contiguous axial images were obtained from the base of the skull through the vertex without intravenous contrast. RADIATION DOSE REDUCTION: This exam was performed according to the departmental dose-optimization program  which includes automated exposure control, adjustment of the mA and/or kV according to patient size and/or use of iterative reconstruction technique. COMPARISON:  None Available. FINDINGS: Brain: No acute intracranial hemorrhage, mass effect, or herniation. No extra-axial fluid collections. No evidence of acute territorial infarct. No hydrocephalus. Vascular: No hyperdense vessel or unexpected calcification. Skull: Normal. Negative for fracture or focal lesion. Sinuses/Orbits: No acute finding. Other: None. IMPRESSION: No acute intracranial process identified. Electronically Signed   By: Jannifer Hick M.D.   On: 03/13/2022 14:53    Procedures Procedures    Medications Ordered in ED Medications - No data to display  ED Course/ Medical Decision Making/ A&P    31 year old here for evaluation of intermittent chest tightness and palpitations and intermittent pinprick sensations since unknowingly using methamphetamines 2 days ago.  This is his third visit.  He appears very anxious.  He is no clinical evidence of VTE on exam.  He is PERC negative, Wells criteria low risk.   Plan on checking labs and reassess  Labs and imaging personally viewed and interpreted:  CBC without leukocytosis Troponin 3 Metabolic panel mild hypokalemia at 3.2, creatinine 1.53, similar to prior Chest x-ray without cardiomegaly, pulm edema, pneumothorax, infiltrates EKG without ischemic changes  Heart score 0  Patient reassessed.  Continues to  be symptomatic.  I suspect his intermittent symptoms are likely due to anxiety and coming off the methamphetamines.  At this time I low suspicion for acute ACS, PE, dissection, infectious process, VTE, ischemia, unstable angina.  No SI, HI, AVH.  With regards to this intermittent pinprick sensation these are bilateral, intermittent.  Symptoms do not seem consistent with TIA, CVA, dissection.  No fasciculations, weakness or numbness on exam.  I discussed sensation from illicit  substances, hydroxyzine as needed for anxiety, return for any worsening symptoms.  Patient agreeable.  The patient has been appropriately medically screened and/or stabilized in the ED. I have low suspicion for any other emergent medical condition which would require further screening, evaluation or treatment in the ED or require inpatient management.  Patient is hemodynamically stable and in no acute distress.  Patient able to ambulate in department prior to ED.  Evaluation does not show acute pathology that would require ongoing or additional emergent interventions while in the emergency department or further inpatient treatment.  I have discussed the diagnosis with the patient and answered all questions.  Pain is been managed while in the emergency department and patient has no further complaints prior to discharge.  Patient is comfortable with plan discussed in room and is stable for discharge at this time.  I have discussed strict return precautions for returning to the emergency department.  Patient was encouraged to follow-up with PCP/specialist refer to at discharge.                            Medical Decision Making Amount and/or Complexity of Data Reviewed External Data Reviewed: labs, radiology, ECG and notes. Labs: ordered. Decision-making details documented in ED Course. Radiology: ordered and independent interpretation performed. Decision-making details documented in ED Course. ECG/medicine tests: ordered and independent interpretation performed. Decision-making details documented in ED Course.  Risk OTC drugs. Prescription drug management. Diagnosis or treatment significantly limited by social determinants of health.         Final Clinical Impression(s) / ED Diagnoses Final diagnoses:  Precordial pain  Anxiety    Rx / DC Orders ED Discharge Orders          Ordered    hydrOXYzine (ATARAX) 25 MG tablet  Every 6 hours        03/14/22 1927               Hailee Hollick A, PA-C 03/14/22 1930    Ernie Avena, MD 03/14/22 2016

## 2022-03-14 NOTE — Discharge Instructions (Signed)
Work-up today was reassuring.  Your heart enzymes were negative I do not feel like you are having a heart attack.  You likely are having a little bit of anxiety since coming off the methamphetamines.  I have started you on hydroxyzine which should help.  Return if you develop new or worsening symptoms

## 2022-03-14 NOTE — ED Triage Notes (Signed)
Patient states he took Ice on Saturday and experienced numbness and elevated heart rate. Patient states that symptoms have continued since Saturday. Patient was seen at urgent care and told that symptoms would resolve themselves. Patient was told to come here for worsening symptoms.

## 2022-03-14 NOTE — ED Provider Triage Note (Cosign Needed Addendum)
Emergency Medicine Provider Triage Evaluation Note  Herbert Montgomery , a 31 y.o. male  was evaluated in triage. Pt complains of high heart rate. He checked it with a heart rate monitor and reports it was 130s to 140s. Also reports some chest pain with SOB which started sometime this morning. Reports using meth once on Saturday. Returned to the ED multiple times for similar symptoms.   Review of Systems  Positive: Chest pain, palpitations, and SOB, Right arm numbness Negative: Syncope  Physical Exam  BP (!) 158/117 (BP Location: Right Arm)   Pulse 77   Temp 98.5 F (36.9 C) (Oral)   Resp 18   Ht 6\' 4"  (1.93 m)   Wt 120.2 kg   SpO2 100%   BMI 32.26 kg/m  Gen:   Awake, no distress   Resp:  Normal effort  MSK:   Moves extremities without difficulty  Other:  CTAB, RRR  Medical Decision Making  Medically screening exam initiated at 2:31 PM.  Appropriate orders placed.  was informed that the remainder of the evaluation will be completed by another provider, this initial triage assessment does not replace that evaluation, and the importance of remaining in the ED until their evaluation is complete.  Labs and imaging ordered. Patient stable and well appearing.    Archie Balboa, PA-C 03/14/22 1441    03/16/22, PA-C 03/14/22 1457

## 2022-03-30 ENCOUNTER — Ambulatory Visit: Payer: BC Managed Care – PPO | Admitting: Family Medicine
